# Patient Record
Sex: Male | Born: 1980 | Race: White | Hispanic: No | Marital: Married | Smoking: Never smoker
Health system: Southern US, Community
[De-identification: ages and names within clinical notes are randomized; demographics above are authoritative.]

## PROBLEM LIST (undated history)

## (undated) DIAGNOSIS — I10 Essential (primary) hypertension: Secondary | ICD-10-CM

---

## 2016-09-04 DIAGNOSIS — R7989 Other specified abnormal findings of blood chemistry: Secondary | ICD-10-CM | POA: Insufficient documentation

## 2016-09-04 DIAGNOSIS — Z8639 Personal history of other endocrine, nutritional and metabolic disease: Secondary | ICD-10-CM | POA: Insufficient documentation

## 2016-09-04 DIAGNOSIS — I10 Essential (primary) hypertension: Secondary | ICD-10-CM | POA: Insufficient documentation

## 2016-09-04 DIAGNOSIS — G8929 Other chronic pain: Secondary | ICD-10-CM | POA: Insufficient documentation

## 2018-11-26 ENCOUNTER — Emergency Department
Admission: EM | Admit: 2018-11-26 | Discharge: 2018-11-26 | Disposition: A | Discharge status: Home / Self Care | Payer: 59 | Attending: Emergency Medicine | Admitting: Emergency Medicine

## 2018-11-26 ENCOUNTER — Other Ambulatory Visit: Payer: Self-pay

## 2018-11-26 ENCOUNTER — Emergency Department: Payer: 59

## 2018-11-26 DIAGNOSIS — N200 Calculus of kidney: Secondary | ICD-10-CM | POA: Insufficient documentation

## 2018-11-26 DIAGNOSIS — I10 Essential (primary) hypertension: Secondary | ICD-10-CM | POA: Diagnosis not present

## 2018-11-26 DIAGNOSIS — R1031 Right lower quadrant pain: Secondary | ICD-10-CM | POA: Diagnosis present

## 2018-11-26 DIAGNOSIS — Z79899 Other long term (current) drug therapy: Secondary | ICD-10-CM | POA: Insufficient documentation

## 2018-11-26 HISTORY — DX: Essential (primary) hypertension: I10

## 2018-11-26 LAB — CBC
HCT: 44 % (ref 39.0–52.0)
Hemoglobin: 15.3 g/dL (ref 13.0–17.0)
MCH: 29.5 pg (ref 26.0–34.0)
MCHC: 34.8 g/dL (ref 30.0–36.0)
MCV: 84.8 fL (ref 80.0–100.0)
Platelets: 203 10*3/uL (ref 150–400)
RBC: 5.19 MIL/uL (ref 4.22–5.81)
RDW: 12.4 % (ref 11.5–15.5)
WBC: 15.7 10*3/uL — ABNORMAL HIGH (ref 4.0–10.5)
nRBC: 0 % (ref 0.0–0.2)

## 2018-11-26 LAB — COMPREHENSIVE METABOLIC PANEL
ALT: 67 U/L — ABNORMAL HIGH (ref 0–44)
AST: 25 U/L (ref 15–41)
Albumin: 4.4 g/dL (ref 3.5–5.0)
Alkaline Phosphatase: 62 U/L (ref 38–126)
Anion gap: 11 (ref 5–15)
BUN: 23 mg/dL — ABNORMAL HIGH (ref 6–20)
CO2: 27 mmol/L (ref 22–32)
Calcium: 9.4 mg/dL (ref 8.9–10.3)
Chloride: 98 mmol/L (ref 98–111)
Creatinine, Ser: 1.08 mg/dL (ref 0.61–1.24)
GFR calc Af Amer: >60 mL/min (ref >60)
GFR calc non Af Amer: >60 mL/min (ref >60)
Glucose, Bld: 110 mg/dL — ABNORMAL HIGH (ref 70–99)
Potassium: 4.3 mmol/L (ref 3.5–5.1)
Sodium: 136 mmol/L (ref 135–145)
Total Bilirubin: 1.4 mg/dL — ABNORMAL HIGH (ref 0.3–1.2)
Total Protein: 8 g/dL (ref 6.5–8.1)

## 2018-11-26 LAB — URINALYSIS, COMPLETE (UACMP) WITH MICROSCOPIC
Bacteria, UA: NONE SEEN
Bilirubin Urine: NEGATIVE
Glucose, UA: NEGATIVE mg/dL
Hgb urine dipstick: NEGATIVE
Ketones, ur: NEGATIVE mg/dL
Leukocytes,Ua: NEGATIVE
Nitrite: NEGATIVE
Protein, ur: NEGATIVE mg/dL
Specific Gravity, Urine: 1.02 (ref 1.005–1.030)
pH: 7 (ref 5.0–8.0)

## 2018-11-26 LAB — LIPASE, BLOOD: Lipase: 18 U/L (ref 11–51)

## 2018-11-26 MED ORDER — IOHEXOL 9 MG/ML PO SOLN
1000.0000 mL | Freq: Once | ORAL | Status: AC | PRN
  Administered 2018-11-26: 1000 mL via ORAL
  Filled 2018-11-26: qty 1000

## 2018-11-26 MED ORDER — HYDROCODONE-ACETAMINOPHEN 5-325 MG PO TABS: 1.0000 | ORAL_TABLET | Freq: Four times a day (QID) | ORAL | 0 refills | Status: AC | PRN

## 2018-11-26 MED ORDER — HYDROCODONE-ACETAMINOPHEN 5-325 MG PO TABS: 1.0000 | ORAL_TABLET | Freq: Four times a day (QID) | ORAL | 0 refills | Status: DC | PRN

## 2018-11-26 MED ORDER — IOHEXOL 300 MG/ML  SOLN
100.0000 mL | Freq: Once | INTRAMUSCULAR | Status: AC | PRN
  Administered 2018-11-26: 100 mL via INTRAVENOUS
  Filled 2018-11-26: qty 100

## 2018-11-26 MED ORDER — ONDANSETRON 4 MG PO TBDP: 4.0000 mg | ORAL_TABLET | Freq: Four times a day (QID) | ORAL | 0 refills | Status: AC | PRN

## 2018-11-26 MED ORDER — TAMSULOSIN HCL 0.4 MG PO CAPS: 0.4000 mg | ORAL_CAPSULE | Freq: Every day | ORAL | 0 refills | Status: AC

## 2018-11-26 NOTE — ED Notes (Signed)
See triage note  Presents with lower abd pain and right side back pain  Denies any fever or n/v

## 2018-11-26 NOTE — ED Provider Notes (Signed)
[facility] Emergency Department Provider Note ____________________________________________   First MD Initiated Contact with Patient 11/26/18 1307     (approximate)  I have reviewed the triage vital signs and the nursing notes.  HISTORY  Chief Complaint Abdominal Pain and Constipation  HPI Benjamin Maynard is a 38 y.o. male here for evaluation of right lower and slightly right lower back pain  Patient reports that few days ago been experiencing achiness in his right lower abdomen.  Felt like he needed to have a bowel movement, started Tuesday his last bowel movement was then.  He also reports that he just this for like an aching especially when he is up moving around or walking.  No fevers or chills.  No diarrhea.  Last bowel movement was Tuesday but still passing gas.  Still eating and drinking, but eating less, only had a candy bar for breakfast this morning  There is reported history of diverticulitis maybe 10 years ago that felt somewhat similar.  Otherwise no significant past medical history other than hypertension  Denies Covid exposure  Past Medical History:  Diagnosis Date   Hypertension     There are no active problems to display for this patient.   History reviewed. No pertinent surgical history.  Prior to Admission medications   Medication Sig Start Date End Date Taking? Authorizing Provider  losartan (COZAAR) 100 MG tablet Take 100 mg by mouth daily.   Yes [provider]  pregabalin (LYRICA) 50 MG capsule Take 50 mg by mouth 3 (three) times daily.   Yes [provider]  HYDROcodone-acetaminophen (NORCO/VICODIN) 5-325 MG tablet Take 1 tablet by mouth every 6 (six) hours as needed for moderate pain. 11/26/18   Sharyn Creamer, MD  ondansetron (ZOFRAN ODT) 4 MG disintegrating tablet Take 1 tablet (4 mg total) by mouth every 6 (six) hours as needed for nausea or vomiting. 11/26/18   Sharyn Creamer, MD  tamsulosin (FLOMAX) 0.4 MG  CAPS capsule Take 1 capsule (0.4 mg total) by mouth daily. 11/26/18   Sharyn Creamer, MD    Allergies Patient has no known allergies.  No family history on file.  Social History Social History   Tobacco Use   Smoking status: Never Smoker   Smokeless tobacco: Never Used  Substance Use Topics   Alcohol use: Yes    Comment: occassionally   Drug use: Never    Review of Systems Constitutional: No fever/chills Eyes: No visual changes. ENT: No sore throat. Cardiovascular: Denies chest pain. Respiratory: Denies shortness of breath. Gastrointestinal: See HPI Genitourinary: Negative for dysuria. Musculoskeletal: Negative for back pain.  Suffers from frequent back pain, but no worsening or difference from his baseline sciatica. Skin: Negative for rash. Neurological: Negative for headaches, areas of focal weakness or numbness. Denies any groin or testicular pain.  No trouble urinating.   ____________________________________________   PHYSICAL EXAM:  VITAL SIGNS: ED Triage Vitals  Enc Vitals Group     BP 11/26/18 1117 (!) 141/104     Pulse Rate 11/26/18 1117 (!) 108     Resp 11/26/18 1117 16     Temp 11/26/18 1117 98.7 F (37.1 C)     Temp Source 11/26/18 1117 Oral     SpO2 11/26/18 1117 97 %     Weight 11/26/18 1117 290 lb (131.5 kg)     Height 11/26/18 1117 5\' 11"  (1.803 m)     Head Circumference --      Peak Flow --  Pain Score 11/26/18 1123 8     Pain Loc --      Pain Edu? --      Excl. in GC? --     Constitutional: Alert and oriented. Well appearing and in no acute distress. Eyes: Conjunctivae are normal. Head: Atraumatic. Nose: No congestion/rhinnorhea. Mouth/Throat: Mucous membranes are moist. Neck: No stridor.  Cardiovascular: Normal rate, regular rhythm. Grossly normal heart sounds.  Good peripheral circulation. Respiratory: Normal respiratory effort.  No retractions. Lungs CTAB. Gastrointestinal: Soft and mild focal tenderness primarily seated in  the suprapubic and right lower quadrant region.  No frank rebound or guarding.  No focal pain at McBurney's point but rather a moderate somewhat nonfocal tenderness primarily over the right lower and moderate flank region.  No left-sided or upper abdominal pain.  Negative Murphy. Musculoskeletal: No lower extremity tenderness nor edema. Neurologic:  Normal speech and language. No gross focal neurologic deficits are appreciated.  Skin:  Skin is warm, dry and intact. No rash noted. Psychiatric: Mood and affect are normal. Speech and behavior are normal.  ____________________________________________   LABS (all labs ordered are listed, but only abnormal results are displayed)  Labs Reviewed  COMPREHENSIVE METABOLIC PANEL - Abnormal; Notable for the following components:      Result Value   Glucose, Bld 110 (*)    BUN 23 (*)    ALT 67 (*)    Total Bilirubin 1.4 (*)    All other components within normal limits  CBC - Abnormal; Notable for the following components:   WBC 15.7 (*)    All other components within normal limits  URINALYSIS, COMPLETE (UACMP) WITH MICROSCOPIC - Abnormal; Notable for the following components:   Color, Urine YELLOW (*)    APPearance CLEAR (*)    All other components within normal limits  LIPASE, BLOOD   ____________________________________________  EKG   ____________________________________________  RADIOLOGY  Ct Abdomen Pelvis W Contrast  Result Date: 11/26/2018 CLINICAL DATA:  Right lower quadrant abdomen pain. Assess for appendicitis. EXAM: CT ABDOMEN AND PELVIS WITH CONTRAST TECHNIQUE: Multidetector CT imaging of the abdomen and pelvis was performed using the standard protocol following bolus administration of intravenous contrast. CONTRAST:  100mL OMNIPAQUE IOHEXOL 300 MG/ML  SOLN COMPARISON:  None. FINDINGS: Lower chest: No acute abnormality. Hepatobiliary: Diffuse low density of liver is identified without focal liver lesion. Gallbladder and biliary  tree are normal. Pancreas: Unremarkable. No pancreatic ductal dilatation or surrounding inflammatory changes. Spleen: Normal in size without focal abnormality. Adrenals/Urinary Tract: The bilateral adrenal glands are normal. There is right hydroureteronephrosis. There is a question 1 mm stone at the right ureteral vesicular junction. Small nonobstructing stone is identified in the right kidney. The left kidney is normal. There is no left hydronephrosis. The bladder is normal. Stomach/Bowel: Stomach is within normal limits. Appendix appears normal. No evidence of bowel wall thickening, distention, or inflammatory changes. Vascular/Lymphatic: No significant vascular findings are present. No enlarged abdominal or pelvic lymph nodes. Reproductive: Prostate is unremarkable. Other: None. Musculoskeletal: No acute abnormality. IMPRESSION: Normal appendix. Right hydronephrosis with question 1 mm obstructing stone in the right ureteral vesicular junction. Electronically Signed   By: Sherian ReinWei-Chen  Lin M.D.   On: 11/26/2018 15:29     Imaging reviewed, probable right-sided very small kidney stone. ____________________________________________   PROCEDURES  Procedure(s) performed: None  Procedures  Critical Care performed: No  ____________________________________________   INITIAL IMPRESSION / ASSESSMENT AND PLAN / ED COURSE  Pertinent labs & imaging results that were available during my  care of the patient were reviewed by me and considered in my medical decision making (see chart for details).   Differential diagnosis includes but is not limited to, abdominal perforation, aortic dissection, cholecystitis, appendicitis, diverticulitis, colitis, esophagitis/gastritis, kidney stone, pyelonephritis, urinary tract infection, aortic aneurysm. All are considered in decision and treatment plan. Based upon the patient's presentation and risk factors, proceed with CT scan to further evaluate exclude appendicitis and  other causes   ----------------------------------------- 3:46 PM on 11/26/2018 ----------------------------------------- Patient ambulatory, CT appears consistent with symptomatology of kidney stone.  Very small.  No evidence of complication.  Slight leukocytosis, but very clean urine.  No evidence of infection no fever.  Patient comfortable with plan for close follow-up. I will prescribe the patient a narcotic pain medicine due to their condition which I anticipate will cause at least moderate pain short term. I discussed with the patient safe use of narcotic pain medicines, and that they are not to drive, work in dangerous areas, or ever take more than prescribed (no more than 1 pill every 6 hours). We discussed that this is the type of medication that can be  overdosed on and the risks of this type of medicine. Patient is very agreeable to only use as prescribed and to never use more than prescribed.  Return precautions and treatment recommendations and follow-up discussed with the patient who is agreeable with the plan.        ____________________________________________   FINAL CLINICAL IMPRESSION(S) / ED DIAGNOSES  Final diagnoses:  Kidney stone on right side        Note:  This document was prepared using Dragon voice recognition software and may include unintentional dictation errors       Delman Kitten, MD 11/26/18 1547

## 2018-11-26 NOTE — ED Triage Notes (Signed)
Reports bilateral lower abdominal pain and right lower back pain  Since Wednesday. Pt has not had BM since Tuesday. Denies NVD. Pt alert and oriented X4, cooperative, RR even and unlabored, color WNL. Pt in NAD.

## 2018-11-26 NOTE — Discharge Instructions (Signed)
You have been seen in the Emergency Department (ED) today for pain that we believe based on your workup, is caused by kidney stones.  As we have discussed, please drink plenty of fluids.  Please make a follow up appointment with the physician(s) listed elsewhere in this documentation. ° °You may take pain medication as needed but ONLY as prescribed.  Please also take your prescribed Flomax daily.  We also recommend that you take over-the-counter ibuprofen regularly according to label instructions over the next 5 days.  Take it with meals to minimize stomach discomfort. ° °Please see your doctor as soon as possible as stones may take 1-3 weeks to pass and you may require additional care or medications. ° °Do not drink alcohol, drive or participate in any other potentially dangerous activities while taking opiate pain medication as it may make you sleepy. Do not take this medication with any other sedating medications, either prescription or over-the-counter. If you were prescribed Percocet or Vicodin, do not take these with acetaminophen (Tylenol) as it is already contained within these medications. °  °This medication is an opiate (or narcotic) pain medication and can be habit forming.  Use it as little as possible to achieve adequate pain control.  Do not use or use it with extreme caution if you have a history of opiate abuse or dependence.  If you are on a pain contract with your primary care doctor or a pain specialist, be sure to let them know you were prescribed this medication today from the [facility].  This medication is intended for your use only - do not give any to anyone else and keep it in a secure place where nobody else, especially children, have access to it.  It will also cause or worsen constipation, so you may want to consider taking an over-the-counter stool softener while you are taking this medication. ° °Return to the Emergency Department (ED) or call your doctor  if you have any worsening pain, fever, painful urination, are unable to urinate, or develop other symptoms that concern you. ° °

## 2018-12-08 DIAGNOSIS — E349 Endocrine disorder, unspecified: Secondary | ICD-10-CM | POA: Insufficient documentation

## 2020-12-31 ENCOUNTER — Other Ambulatory Visit: Payer: Self-pay

## 2020-12-31 ENCOUNTER — Ambulatory Visit
Admission: RE | Admit: 2020-12-31 | Discharge: 2020-12-31 | Disposition: A | Discharge status: Home / Self Care | Payer: 59 | Source: Ambulatory Visit | Attending: Family Medicine | Admitting: Family Medicine

## 2020-12-31 VITALS — BP 162/90 | HR 124 | Temp 98.2°F | Resp 20

## 2020-12-31 DIAGNOSIS — J069 Acute upper respiratory infection, unspecified: Secondary | ICD-10-CM

## 2020-12-31 DIAGNOSIS — J209 Acute bronchitis, unspecified: Secondary | ICD-10-CM | POA: Diagnosis not present

## 2020-12-31 MED ORDER — PREDNISONE 20 MG PO TABS: 20.0000 mg | ORAL_TABLET | Freq: Every day | ORAL | 0 refills | Status: AC

## 2020-12-31 MED ORDER — LEVOCETIRIZINE DIHYDROCHLORIDE 5 MG PO TABS: 5.0000 mg | ORAL_TABLET | Freq: Every evening | ORAL | 0 refills | Status: AC

## 2020-12-31 MED ORDER — ALBUTEROL SULFATE HFA 108 (90 BASE) MCG/ACT IN AERS: 2.0000 | INHALATION_SPRAY | Freq: Four times a day (QID) | RESPIRATORY_TRACT | 0 refills | Status: AC | PRN

## 2020-12-31 NOTE — ED Triage Notes (Signed)
Pt here with URI sx x 5 days. Intermittent low grade fevers.

## 2020-12-31 NOTE — ED Provider Notes (Signed)
Renaldo Fiddler    CSN: [id number] Arrival date & time: 12/31/20  1230      History   Chief Complaint Chief Complaint  Patient presents with   Cough   Nasal Congestion    HPI Benjamin Maynard is a 40 y.o. male.   HPI Patient presents today with a 5-day history of nasal congestion, sore throat, generalized body aches, and a persistent cough which is now developed into chest congestion and chest tightness.  Patient denies a known history of asthma however has had some reactive airway symptoms with previous colds and required an albuterol inhaler.  He endorses some mild shortness of breath intermittently.  All of his symptoms are worse upon awakening and with going to bed in the evenings.  He is currently afebrile.  Past Medical History:  Diagnosis Date   Hypertension     There are no problems to display for this patient.   History reviewed. No pertinent surgical history.     Home Medications    Prior to Admission medications   Medication Sig Start Date End Date Taking? Authorizing Provider  albuterol (VENTOLIN HFA) 108 (90 Base) MCG/ACT inhaler Inhale 2 puffs into the lungs every 6 (six) hours as needed for wheezing or shortness of breath. 12/31/20  Yes Bing Neighbors, FNP  levocetirizine (XYZAL) 5 MG tablet Take 1 tablet (5 mg total) by mouth every evening. 12/31/20  Yes Bing Neighbors, FNP  predniSONE (DELTASONE) 20 MG tablet Take 1 tablet (20 mg total) by mouth daily with breakfast for 5 days. 12/31/20 01/05/21 Yes Bing Neighbors, FNP  HYDROcodone-acetaminophen (NORCO/VICODIN) 5-325 MG tablet Take 1 tablet by mouth every 6 (six) hours as needed for moderate pain. 11/26/18   Sharyn Creamer, MD  losartan (COZAAR) 100 MG tablet Take 100 mg by mouth daily.    [provider]  ondansetron (ZOFRAN ODT) 4 MG disintegrating tablet Take 1 tablet (4 mg total) by mouth every 6 (six) hours as needed for nausea or vomiting. 11/26/18   Sharyn Creamer, MD   pregabalin (LYRICA) 50 MG capsule Take 50 mg by mouth 3 (three) times daily.    [provider]  tamsulosin (FLOMAX) 0.4 MG CAPS capsule Take 1 capsule (0.4 mg total) by mouth daily. 11/26/18   Sharyn Creamer, MD    Family History Family History  Family history unknown: Yes    Social History Social History   Tobacco Use   Smoking status: Never   Smokeless tobacco: Never  Substance Use Topics   Alcohol use: Yes    Comment: occassionally   Drug use: Never     Allergies   Patient has no known allergies.   Review of Systems Review of Systems Pertinent negatives listed in HPI   Physical Exam Triage Vital Signs ED Triage Vitals  Enc Vitals Group     BP 12/31/20 1248 (!) 162/90     Pulse Rate 12/31/20 1248 (!) 124     Resp 12/31/20 1248 20     Temp 12/31/20 1248 98.2 F (36.8 C)     Temp src --      SpO2 12/31/20 1248 99 %     Weight --      Height --      Head Circumference --      Peak Flow --      Pain Score 12/31/20 1252 3     Pain Loc --      Pain Edu? --      Excl.  in GC? --    No data found.  Updated Vital Signs BP (!) 162/90    Pulse (!) 124    Temp 98.2 F (36.8 C)    Resp 20    SpO2 99%   Visual Acuity Right Eye Distance:   Left Eye Distance:   Bilateral Distance:    Right Eye Near:   Left Eye Near:    Bilateral Near:     Physical Exam Vitals reviewed.  Constitutional:      Appearance: He is obese. He is ill-appearing.  HENT:     Head: Normocephalic.     Nose: Congestion present.  Eyes:     Extraocular Movements: Extraocular movements intact.     Pupils: Pupils are equal, round, and reactive to light.  Cardiovascular:     Rate and Rhythm: Regular rhythm. Tachycardia present.  Pulmonary:     Effort: Pulmonary effort is normal.     Breath sounds: Rhonchi present.     Comments: Expiratory wheeze present  Lymphadenopathy:     Cervical: No cervical adenopathy.  Skin:    Capillary Refill: Capillary refill takes less than 2  seconds.  Neurological:     General: No focal deficit present.     Mental Status: He is alert and oriented to person, place, and time.     UC Treatments / Results  Labs (all labs ordered are listed, but only abnormal results are displayed) Labs Reviewed - No data to display  EKG   Radiology No results found.  Procedures Procedures (including critical care time)  Medications Ordered in UC Medications - No data to display  Initial Impression / Assessment and Plan / UC Course  I have reviewed the triage vital signs and the nursing notes.  Pertinent labs & imaging results that were available during my care of the patient were reviewed by me and considered in my medical decision making (see chart for details).     Acute bronchitis likely secondary to recent viral upper respiratory illness.  Treatment today with prednisone 20 mg once daily for 5 days.  Albuterol inhaler 2 puffs every 4-6 hours as needed for chest tightness or shortness of breath.  Levocetirizine nightly at bedtime for management of nasal symptoms.  Return precautions given if symptoms worsen or do not improve.  Force fluids. Final Clinical Impressions(s) / UC Diagnoses   Final diagnoses:  Acute bronchitis, unspecified organism  Acute URI   Discharge Instructions   None    ED Prescriptions     Medication Sig Dispense Auth. Provider   predniSONE (DELTASONE) 20 MG tablet Take 1 tablet (20 mg total) by mouth daily with breakfast for 5 days. 5 tablet Bing Neighbors, FNP   albuterol (VENTOLIN HFA) 108 (90 Base) MCG/ACT inhaler Inhale 2 puffs into the lungs every 6 (six) hours as needed for wheezing or shortness of breath. 8 g Bing Neighbors, FNP   levocetirizine (XYZAL) 5 MG tablet Take 1 tablet (5 mg total) by mouth every evening. 30 tablet Bing Neighbors, FNP      PDMP not reviewed this encounter.   Bing Neighbors, FNP 12/31/20 1322

## 2021-02-26 ENCOUNTER — Other Ambulatory Visit: Payer: Self-pay | Admitting: Family Medicine

## 2021-03-05 ENCOUNTER — Other Ambulatory Visit: Payer: Self-pay

## 2021-03-05 ENCOUNTER — Ambulatory Visit: Payer: 59

## 2021-03-05 ENCOUNTER — Emergency Department: Payer: 59

## 2021-03-05 ENCOUNTER — Emergency Department
Admission: EM | Admit: 2021-03-05 | Discharge: 2021-03-05 | Disposition: A | Discharge status: Home / Self Care | Payer: 59 | Attending: Student in an Organized Health Care Education/Training Program | Admitting: Student in an Organized Health Care Education/Training Program

## 2021-03-05 DIAGNOSIS — R109 Unspecified abdominal pain: Secondary | ICD-10-CM | POA: Diagnosis present

## 2021-03-05 DIAGNOSIS — K429 Umbilical hernia without obstruction or gangrene: Secondary | ICD-10-CM | POA: Insufficient documentation

## 2021-03-05 DIAGNOSIS — Z79899 Other long term (current) drug therapy: Secondary | ICD-10-CM | POA: Diagnosis not present

## 2021-03-05 DIAGNOSIS — I10 Essential (primary) hypertension: Secondary | ICD-10-CM | POA: Insufficient documentation

## 2021-03-05 LAB — URINALYSIS, ROUTINE W REFLEX MICROSCOPIC
Bilirubin Urine: NEGATIVE
Glucose, UA: NEGATIVE mg/dL
Hgb urine dipstick: NEGATIVE
Ketones, ur: NEGATIVE mg/dL
Leukocytes,Ua: NEGATIVE
Nitrite: NEGATIVE
Protein, ur: NEGATIVE mg/dL
Specific Gravity, Urine: 1.004 — ABNORMAL LOW (ref 1.005–1.030)
pH: 7 (ref 5.0–8.0)

## 2021-03-05 LAB — COMPREHENSIVE METABOLIC PANEL
ALT: 64 U/L — ABNORMAL HIGH (ref 0–44)
AST: 40 U/L (ref 15–41)
Albumin: 4.5 g/dL (ref 3.5–5.0)
Alkaline Phosphatase: 52 U/L (ref 38–126)
Anion gap: 11 (ref 5–15)
BUN: 17 mg/dL (ref 6–20)
CO2: 29 mmol/L (ref 22–32)
Calcium: 9.7 mg/dL (ref 8.9–10.3)
Chloride: 101 mmol/L (ref 98–111)
Creatinine, Ser: 0.85 mg/dL (ref 0.61–1.24)
GFR, Estimated: >60 mL/min (ref >60)
Glucose, Bld: 100 mg/dL — ABNORMAL HIGH (ref 70–99)
Potassium: 4 mmol/L (ref 3.5–5.1)
Sodium: 141 mmol/L (ref 135–145)
Total Bilirubin: 2 mg/dL — ABNORMAL HIGH (ref 0.3–1.2)
Total Protein: 7.4 g/dL (ref 6.5–8.1)

## 2021-03-05 LAB — CBC
HCT: 49.7 % (ref 39.0–52.0)
Hemoglobin: 16.3 g/dL (ref 13.0–17.0)
MCH: 28.5 pg (ref 26.0–34.0)
MCHC: 32.8 g/dL (ref 30.0–36.0)
MCV: 87 fL (ref 80.0–100.0)
Platelets: 236 10*3/uL (ref 150–400)
RBC: 5.71 MIL/uL (ref 4.22–5.81)
RDW: 14.6 % (ref 11.5–15.5)
WBC: 8.8 10*3/uL (ref 4.0–10.5)
nRBC: 0 % (ref 0.0–0.2)

## 2021-03-05 LAB — LIPASE, BLOOD: Lipase: 29 U/L (ref 11–51)

## 2021-03-05 MED ORDER — IOHEXOL 300 MG/ML  SOLN
100.0000 mL | Freq: Once | INTRAMUSCULAR | Status: AC | PRN
  Administered 2021-03-05: 100 mL via INTRAVENOUS

## 2021-03-05 NOTE — ED Provider Notes (Signed)
[facility] EMERGENCY DEPARTMENT Provider Note   CSN: [id number] Arrival date & time: 03/05/21  1415     History  Chief Complaint  Patient presents with   Abdominal Pain   Hernia    Benjamin Maynard is a 41 y.o. male.  With history of hypertension and chronic umbilical hernia presents to the emergency department for evaluation of abdominal pain.  States he has had pain for few days with no known trauma or injury.  Pain has slowly been improving.  Pain is currently 4 out of 10.  He is going out of town at the end of this week and was wanting to be evaluated before traveling.  He has been taking Tylenol and ibuprofen with some relief.  He denies any nausea, vomiting, fever, chills.  HPI     Home Medications Prior to Admission medications   Medication Sig Start Date End Date Taking? Authorizing Provider  albuterol (VENTOLIN HFA) 108 (90 Base) MCG/ACT inhaler Inhale 2 puffs into the lungs every 6 (six) hours as needed for wheezing or shortness of breath. 12/31/20   Scot Jun, FNP  HYDROcodone-acetaminophen (NORCO/VICODIN) 5-325 MG tablet Take 1 tablet by mouth every 6 (six) hours as needed for moderate pain. 11/26/18   Delman Kitten, MD  levocetirizine (XYZAL) 5 MG tablet Take 1 tablet (5 mg total) by mouth every evening. 12/31/20   Scot Jun, FNP  losartan (COZAAR) 100 MG tablet Take 100 mg by mouth daily.    [provider]  ondansetron (ZOFRAN ODT) 4 MG disintegrating tablet Take 1 tablet (4 mg total) by mouth every 6 (six) hours as needed for nausea or vomiting. 11/26/18   Delman Kitten, MD  pregabalin (LYRICA) 50 MG capsule Take 50 mg by mouth 3 (three) times daily.    [provider]  tamsulosin (FLOMAX) 0.4 MG CAPS capsule Take 1 capsule (0.4 mg total) by mouth daily. 11/26/18   Delman Kitten, MD      Allergies    Patient has no known allergies.    Review of Systems   Review of Systems  Physical Exam Updated Vital  Signs BP 133/90 (BP Location: Left Arm)    Pulse 93    Temp 97.9 F (36.6 C) (Oral)    Resp 16    Ht 5\' 11"  (1.803 m)    Wt 133.8 kg    SpO2 97%    BMI 41.14 kg/m  Physical Exam Constitutional:      Appearance: He is well-developed.  HENT:     Head: Normocephalic and atraumatic.  Eyes:     Conjunctiva/sclera: Conjunctivae normal.  Cardiovascular:     Rate and Rhythm: Normal rate.  Pulmonary:     Effort: Pulmonary effort is normal. No respiratory distress.     Breath sounds: Normal breath sounds.  Abdominal:     General: Abdomen is flat.     Palpations: Abdomen is soft.     Tenderness: There is abdominal tenderness (mild) in the periumbilical area. There is no guarding or rebound.     Hernia: A hernia is present. Hernia is present in the umbilical area (soft, reducible no warmth or redness).  Musculoskeletal:        General: Normal range of motion.     Cervical back: Normal range of motion.  Skin:    General: Skin is warm.     Findings: No rash.  Neurological:     Mental Status: He is alert and oriented to person, place, and  time.  Psychiatric:        Behavior: Behavior normal.        Thought Content: Thought content normal.    ED Results / Procedures / Treatments   Labs (all labs ordered are listed, but only abnormal results are displayed) Labs Reviewed  COMPREHENSIVE METABOLIC PANEL - Abnormal; Notable for the following components:      Result Value   Glucose, Bld 100 (*)    ALT 64 (*)    Total Bilirubin 2.0 (*)    All other components within normal limits  URINALYSIS, ROUTINE W REFLEX MICROSCOPIC - Abnormal; Notable for the following components:   Color, Urine STRAW (*)    APPearance CLEAR (*)    Specific Gravity, Urine 1.004 (*)    All other components within normal limits  LIPASE, BLOOD  CBC    EKG None  Radiology CT ABDOMEN PELVIS W CONTRAST  Result Date: 03/05/2021 CLINICAL DATA:  Abdominal pain.  Known hernia for 10 years. EXAM: CT ABDOMEN AND PELVIS  WITH CONTRAST TECHNIQUE: Multidetector CT imaging of the abdomen and pelvis was performed using the standard protocol following bolus administration of intravenous contrast. RADIATION DOSE REDUCTION: This exam was performed according to the departmental dose-optimization program which includes automated exposure control, adjustment of the mA and/or kV according to patient size and/or use of iterative reconstruction technique. CONTRAST:  180mL OMNIPAQUE IOHEXOL 300 MG/ML  SOLN COMPARISON:  11/26/2018 FINDINGS: Lower chest: No acute abnormality. Hepatobiliary: No focal liver abnormality is seen. No gallstones, gallbladder wall thickening, or biliary dilatation. Pancreas: Unremarkable. No pancreatic ductal dilatation or surrounding inflammatory changes. Spleen: Normal in size without focal abnormality. Adrenals/Urinary Tract: Adrenal glands are unremarkable. Punctate nonobstructing right renal calculus. Punctate nonobstructing left renal calculus. No hydronephrosis. No renal mass. Normal bladder. Stomach/Bowel: Stomach is within normal limits. No evidence of bowel wall thickening, distention, or inflammatory changes. Appendix is normal. Vascular/Lymphatic: No significant vascular findings are present. No enlarged abdominal or pelvic lymph nodes. Reproductive: Prostate is unremarkable. Other: No abdominal wall hernia or abnormality. No abdominopelvic ascites. Musculoskeletal: No acute osseous abnormality. No aggressive osseous lesion. Mild bilateral facet arthropathy at L3-4, L4-5 and L5-S1. IMPRESSION: 1. No acute abdominal or pelvic pathology. 2. Punctate nonobstructing bilateral renal calculi. Electronically Signed   By: Kathreen Devoid M.D.   On: 03/05/2021 16:25    Procedures Procedures    Medications Ordered in ED Medications  iohexol (OMNIPAQUE) 300 MG/ML solution 100 mL (100 mLs Intravenous Contrast Given 03/05/21 1614)    ED Course/ Medical Decision Making/ A&P                           Medical  Decision Making Amount and/or Complexity of Data Reviewed Labs: ordered.   41 year old male with history of umbilical hernia presents with several days of pain along the umbilical region.  Abdomen soft minimally tender to palpation.  Overall his pain has been improving.  Blood work is reassuring, normal with no leukocytosis.  Normal urinalysis.  CT scan was ordered and reviewed by me today showing no signs of incarceration hernia, infectious process or appendicitis.  Patient appears well in no distress.  Vital signs are stable and he is ready for discharge.  He is encouraged to follow-up with general surgery, will call tomorrow to schedule follow-up appointment. Final Clinical Impression(s) / ED Diagnoses Final diagnoses:  Umbilical hernia without obstruction and without gangrene    Rx / DC Orders ED Discharge Orders  None         Renata Caprice 03/05/21 1816    Merlyn Lot, MD 03/05/21 2005

## 2021-03-05 NOTE — ED Triage Notes (Signed)
Pt here with abd pain and a hernia that began hurting on yesterday. Pt has a known hernia for 10 years but states that it was hard and painful and radiating all over his abd. Pt denies N/V/D. Pt in NAD in triage.

## 2021-03-05 NOTE — Discharge Instructions (Signed)
Please alternate Tylenol and ibuprofen as needed for any pain.  Avoid any heavy lifting pushing or pulling.  If any reoccurring pain/swelling that is severe and not improving would recommend returning to the ER.

## 2021-03-05 NOTE — ED Provider Triage Note (Signed)
Emergency Medicine Provider Triage Evaluation Note  Benjamin Maynard , a 41 y.o. male  was evaluated in triage.  Pt complains of umbilical, abdominal pain, worsening umbilical hernia.  Review of Systems  Positive: Abdominal pain with umbilical hernia Negative: Fever, chills  Physical Exam  BP (!) 143/99 (BP Location: Left Arm)    Pulse 100    Temp 97.9 F (36.6 C) (Oral)    Resp 18    Ht 5\' 11"  (1.803 m)    Wt 133.8 kg    SpO2 95%    BMI 41.14 kg/m  Gen:   Awake, no distress   Resp:  Normal effort  MSK:   Moves extremities without difficulty  Other:    Medical Decision Making  Medically screening exam initiated at 2:45 PM.  Appropriate orders placed.  [name] was informed that the remainder of the evaluation will be completed by another provider, this initial triage assessment does not replace that evaluation, and the importance of remaining in the ED until their evaluation is complete.  We will do CT with contrast to assess hernia   [name], PA-C 03/05/21 1447

## 2021-08-20 ENCOUNTER — Emergency Department
Admission: EM | Admit: 2021-08-20 | Discharge: 2021-08-20 | Disposition: A | Discharge status: Home / Self Care | Payer: 59 | Attending: Emergency Medicine | Admitting: Emergency Medicine

## 2021-08-20 ENCOUNTER — Encounter: Payer: Self-pay | Admitting: Emergency Medicine

## 2021-08-20 ENCOUNTER — Other Ambulatory Visit: Payer: Self-pay

## 2021-08-20 DIAGNOSIS — K2900 Acute gastritis without bleeding: Secondary | ICD-10-CM | POA: Diagnosis not present

## 2021-08-20 DIAGNOSIS — I10 Essential (primary) hypertension: Secondary | ICD-10-CM | POA: Insufficient documentation

## 2021-08-20 DIAGNOSIS — R1013 Epigastric pain: Secondary | ICD-10-CM | POA: Diagnosis present

## 2021-08-20 LAB — CBC
HCT: 52.8 % — ABNORMAL HIGH (ref 39.0–52.0)
Hemoglobin: 17.6 g/dL — ABNORMAL HIGH (ref 13.0–17.0)
MCH: 28.7 pg (ref 26.0–34.0)
MCHC: 33.3 g/dL (ref 30.0–36.0)
MCV: 86 fL (ref 80.0–100.0)
Platelets: 226 10*3/uL (ref 150–400)
RBC: 6.14 MIL/uL — ABNORMAL HIGH (ref 4.22–5.81)
RDW: 12.8 % (ref 11.5–15.5)
WBC: 10 10*3/uL (ref 4.0–10.5)
nRBC: 0 % (ref 0.0–0.2)

## 2021-08-20 LAB — URINALYSIS, ROUTINE W REFLEX MICROSCOPIC
Bilirubin Urine: NEGATIVE
Glucose, UA: NEGATIVE mg/dL
Hgb urine dipstick: NEGATIVE
Ketones, ur: NEGATIVE mg/dL
Leukocytes,Ua: NEGATIVE
Nitrite: NEGATIVE
Protein, ur: 30 mg/dL — AB
Specific Gravity, Urine: 1.02 (ref 1.005–1.030)
Squamous Epithelial / HPF: NONE SEEN (ref 0–5)
pH: 7 (ref 5.0–8.0)

## 2021-08-20 LAB — COMPREHENSIVE METABOLIC PANEL
ALT: 74 U/L — ABNORMAL HIGH (ref 0–44)
AST: 33 U/L (ref 15–41)
Albumin: 4.4 g/dL (ref 3.5–5.0)
Alkaline Phosphatase: 73 U/L (ref 38–126)
Anion gap: 8 (ref 5–15)
BUN: 10 mg/dL (ref 6–20)
CO2: 27 mmol/L (ref 22–32)
Calcium: 9.4 mg/dL (ref 8.9–10.3)
Chloride: 105 mmol/L (ref 98–111)
Creatinine, Ser: 0.68 mg/dL (ref 0.61–1.24)
GFR, Estimated: >60 mL/min (ref >60)
Glucose, Bld: 129 mg/dL — ABNORMAL HIGH (ref 70–99)
Potassium: 4.4 mmol/L (ref 3.5–5.1)
Sodium: 140 mmol/L (ref 135–145)
Total Bilirubin: 1.2 mg/dL (ref 0.3–1.2)
Total Protein: 7.8 g/dL (ref 6.5–8.1)

## 2021-08-20 LAB — LIPASE, BLOOD: Lipase: 28 U/L (ref 11–51)

## 2021-08-20 LAB — TROPONIN I (HIGH SENSITIVITY): Troponin I (High Sensitivity): 5 ng/L (ref <18)

## 2021-08-20 MED ORDER — ALUM & MAG HYDROXIDE-SIMETH 200-200-20 MG/5ML PO SUSP
15.0000 mL | Freq: Once | ORAL | Status: AC
  Administered 2021-08-20: 15 mL via ORAL
  Filled 2021-08-20: qty 30

## 2021-08-20 MED ORDER — OMEPRAZOLE MAGNESIUM 20 MG PO TBEC: 20.0000 mg | DELAYED_RELEASE_TABLET | Freq: Every day | ORAL | 1 refills | Status: AC

## 2021-08-20 MED ORDER — LIDOCAINE VISCOUS HCL 2 % MT SOLN
15.0000 mL | Freq: Once | OROMUCOSAL | Status: AC
  Administered 2021-08-20: 15 mL via OROMUCOSAL
  Filled 2021-08-20: qty 15

## 2021-08-20 NOTE — ED Provider Notes (Signed)
[facility] Provider Note    Event Date/Time   First MD Initiated Contact with Patient 08/20/21 0930     (approximate)   History   Chief Complaint Abdominal Pain   HPI  Benjamin Maynard is a 41 y.o. male with past medical history of hypertension who presents to the ED complaining of abdominal pain.  Patient reports that he has had about 4 days of constant pain starting in the bilateral lower quadrants of his abdomen and moving upwards towards his epigastric area and lower chest.  He describes the pain as mild and a dull ache, currently rated as a 3 out of 10.  Pain does not seem to be exacerbated or alleviated by anything particular.  He states he has been able to eat and drink without difficulty.  He does state he feels nauseous at times like he has to throw up or have a bowel movement, denies any current nausea and has not vomited.  His bowel movements have been normal with no diarrhea or constipation, he also denies any hematuria or dysuria.  He had recent umbilical hernia repair, denies any pain or swelling around his umbilicus.      Physical Exam   Triage Vital Signs: ED Triage Vitals [08/20/21 0758]  Enc Vitals Group     BP (!) 149/108     Pulse Rate (!) 103     Resp 18     Temp 98 F (36.7 C)     Temp Source Oral     SpO2 98 %     Weight (!) 305 lb (138.3 kg)     Height 5\' 11"  (1.803 m)     Head Circumference      Peak Flow      Pain Score 4     Pain Loc      Pain Edu?      Excl. in GC?     Most recent vital signs: Vitals:   08/20/21 0758 08/20/21 0954  BP: (!) 149/108 (!) 145/105  Pulse: (!) 103   Resp: 18   Temp: 98 F (36.7 C)   SpO2: 98%     Constitutional: Alert and oriented. Eyes: Conjunctivae are normal. Head: Atraumatic. Nose: No congestion/rhinnorhea. Mouth/Throat: Mucous membranes are moist.  Cardiovascular: Normal rate, regular rhythm. Grossly normal heart sounds.  2+ radial pulses bilaterally. Respiratory:  Normal respiratory effort.  No retractions. Lungs CTAB. Gastrointestinal: Soft and nontender. No distention. Musculoskeletal: No lower extremity tenderness nor edema.  Neurologic:  Normal speech and language. No gross focal neurologic deficits are appreciated.    ED Results / Procedures / Treatments   Labs (all labs ordered are listed, but only abnormal results are displayed) Labs Reviewed  COMPREHENSIVE METABOLIC PANEL - Abnormal; Notable for the following components:      Result Value   Glucose, Bld 129 (*)    ALT 74 (*)    All other components within normal limits  CBC - Abnormal; Notable for the following components:   RBC 6.14 (*)    Hemoglobin 17.6 (*)    HCT 52.8 (*)    All other components within normal limits  URINALYSIS, ROUTINE W REFLEX MICROSCOPIC - Abnormal; Notable for the following components:   Color, Urine AMBER (*)    APPearance HAZY (*)    Protein, ur 30 (*)    Bacteria, UA RARE (*)    All other components within normal limits  LIPASE, BLOOD  TROPONIN I (HIGH SENSITIVITY)  EKG  ED ECG REPORT I, Chesley Noon, the attending physician, personally viewed and interpreted this ECG.   Date: 08/20/2021  EKG Time: 8:04  Rate: 95  Rhythm: normal sinus rhythm  Axis: Normal  Intervals:none  ST&T Change: None  PROCEDURES:  Critical Care performed: No  Procedures   MEDICATIONS ORDERED IN ED: Medications  alum & mag hydroxide-simeth (MAALOX/MYLANTA) 200-200-20 MG/5ML suspension 15 mL (15 mLs Oral Given 08/20/21 1026)  lidocaine (XYLOCAINE) 2 % viscous mouth solution 15 mL (15 mLs Mouth/Throat Given 08/20/21 1026)     IMPRESSION / MDM / ASSESSMENT AND PLAN / ED COURSE  I reviewed the triage vital signs and the nursing notes.                              41 y.o. male with past medical history of hypertension presents to the ED complaining of 4 days of constant bilateral lower quadrant abdominal pain moving upwards into his epigastric area and lower  chest.  Patient's presentation is most consistent with acute presentation with potential threat to life or bodily function.  Differential diagnosis includes, but is not limited to, diverticulitis, appendicitis, kidney stone, bowel obstruction, pancreatitis, hepatitis, cholecystitis, biliary colic, gastritis GERD, ACS.  Patient well-appearing and in no acute distress, vital signs are remarkable for elevated blood pressure but otherwise reassuring.  EKG shows no evidence of arrhythmia or ischemia, troponin within normal limits and I doubt ACS, PE, or dissection.  Abdominal exam is also benign and labs are reassuring with no significant anemia, leukocytosis, electrolyte abnormality, or AKI.  LFTs and lipase are also unremarkable.  Symptoms seem most consistent with gastritis/GERD and we will treat with GI cocktail, hold off on CT scan for now.  Patient reports resolution of pain following GI cocktail, he is appropriate for discharge home with PCP follow-up.  He will be prescribed PPI and was counseled to take Pepcid and Tums as needed.  He was counseled to follow-up with his PCP and to return to the ED for new or worsening symptoms, patient agrees with plan.      FINAL CLINICAL IMPRESSION(S) / ED DIAGNOSES   Final diagnoses:  Epigastric pain  Acute gastritis without hemorrhage, unspecified gastritis type     Rx / DC Orders   ED Discharge Orders          Ordered    omeprazole (PRILOSEC OTC) 20 MG tablet  Daily        08/20/21 1211             Note:  This document was prepared using Dragon voice recognition software and may include unintentional dictation errors.   Chesley Noon, MD 08/20/21 1212

## 2021-08-20 NOTE — ED Triage Notes (Signed)
Patient to ED for lower abd pain x4 days that radiates into lower chest. Patient states he feels like he "needs to poop or throw up." Patient states he did have diarrhea on Saturday night but denies vomiting.

## 2021-08-20 NOTE — ED Notes (Signed)
Pt reports relief from GI Cocktail. No more needs at this time.

## 2021-10-08 DIAGNOSIS — Z09 Encounter for follow-up examination after completed treatment for conditions other than malignant neoplasm: Secondary | ICD-10-CM | POA: Insufficient documentation

## 2021-10-17 DIAGNOSIS — E782 Mixed hyperlipidemia: Secondary | ICD-10-CM | POA: Insufficient documentation

## 2021-12-16 ENCOUNTER — Ambulatory Visit (INDEPENDENT_AMBULATORY_CARE_PROVIDER_SITE_OTHER): Payer: 59

## 2021-12-16 ENCOUNTER — Ambulatory Visit
Admission: EM | Admit: 2021-12-16 | Discharge: 2021-12-16 | Disposition: A | Discharge status: Home / Self Care | Payer: 59

## 2021-12-16 DIAGNOSIS — M79645 Pain in left finger(s): Secondary | ICD-10-CM | POA: Diagnosis not present

## 2021-12-16 MED ORDER — PREDNISONE 20 MG PO TABS: ORAL_TABLET | ORAL | 0 refills | Status: AC

## 2021-12-16 NOTE — ED Triage Notes (Signed)
Pt. Presents to UC w/ c/o pain in his left index finger that radiates down his hand for the past 4-5 days. Pt. States he has had no recent injuries.

## 2021-12-16 NOTE — ED Provider Notes (Signed)
Renaldo Fiddler    CSN: [id number] Arrival date & time: 12/16/21  1654      History   Chief Complaint Chief Complaint  Patient presents with   Hand Pain    HPI Benjamin Maynard is a 41 y.o. male.    Hand Pain    Presents to urgent care with pain in his left index finger that radiates into his hand x 4 to 5 days.  He denies any recent injury or known precipitating event.  Denies any change in sensation or motor function.  Describes pain as stabbing.  Past Medical History:  Diagnosis Date   Hypertension     Patient Active Problem List   Diagnosis Date Noted   Mixed hyperlipidemia 10/17/2021   S/P umbilical hernia repair, follow-up exam 10/08/2021   Morbid obesity with BMI of 40.0-44.9, adult (HCC) 04/18/2021   Testosterone deficiency 12/08/2018   Chronic low back pain with sciatica 09/04/2016   Essential hypertension 09/04/2016   H/O vitamin D deficiency 09/04/2016   Low testosterone level in male 09/04/2016    History reviewed. No pertinent surgical history.     Home Medications    Prior to Admission medications   Medication Sig Start Date End Date Taking? Authorizing Provider  ALPRAZolam Prudy Feeler) 0.5 MG tablet Take 1-2 tablets 30 minutes-1 hour prior to flights 03/20/20  Yes [provider]  atorvastatin (LIPITOR) 10 MG tablet Take 1 tablet by mouth daily. 10/21/21  Yes [provider]  cyclobenzaprine (FLEXERIL) 10 MG tablet Take by mouth. 09/30/21 09/25/22 Yes [provider]  metFORMIN (GLUCOPHAGE) 500 MG tablet Take by mouth. 10/21/21  Yes [provider]  omeprazole (PRILOSEC) 20 MG capsule Take 20 mg by mouth daily. 09/12/21  Yes [provider]  testosterone cypionate (DEPOTESTOSTERONE CYPIONATE) 200 MG/ML injection Inject into the muscle. 10/29/21  Yes [provider]  valsartan (DIOVAN) 320 MG tablet Take 320 mg by mouth daily. 09/23/21  Yes [provider]  valsartan-hydrochlorothiazide  (DIOVAN-HCT) 320-12.5 MG tablet Take 1 tablet by mouth daily. 10/21/21  Yes [provider]  albuterol (VENTOLIN HFA) 108 (90 Base) MCG/ACT inhaler Inhale 2 puffs into the lungs every 6 (six) hours as needed for wheezing or shortness of breath. 12/31/20   Bing Neighbors, FNP  Cholecalciferol 50 MCG (2000 UT) CAPS Take by mouth.    [provider]  HYDROcodone-acetaminophen (NORCO/VICODIN) 5-325 MG tablet Take 1 tablet by mouth every 6 (six) hours as needed for moderate pain. 11/26/18   Sharyn Creamer, MD  levocetirizine (XYZAL) 5 MG tablet Take 1 tablet (5 mg total) by mouth every evening. 12/31/20   Bing Neighbors, FNP  losartan (COZAAR) 100 MG tablet Take 100 mg by mouth daily.    [provider]  Multiple Vitamin (MULTI-VITAMIN) tablet Take 1 tablet by mouth daily.    [provider]  naproxen sodium (ALEVE) 220 MG tablet Take by mouth.    [provider]  omeprazole (PRILOSEC OTC) 20 MG tablet Take 1 tablet (20 mg total) by mouth daily. 08/20/21 08/20/22  Chesley Noon, MD  ondansetron (ZOFRAN ODT) 4 MG disintegrating tablet Take 1 tablet (4 mg total) by mouth every 6 (six) hours as needed for nausea or vomiting. 11/26/18   Sharyn Creamer, MD  pregabalin (LYRICA) 50 MG capsule Take 50 mg by mouth 3 (three) times daily.    [provider]  tamsulosin (FLOMAX) 0.4 MG CAPS capsule Take 1 capsule (0.4 mg total) by mouth daily. 11/26/18  Sharyn Creamer, MD    Family History Family History  Family history unknown: Yes    Social History Social History   Tobacco Use   Smoking status: Never   Smokeless tobacco: Never  Substance Use Topics   Alcohol use: Yes    Comment: occassionally   Drug use: Never     Allergies   Patient has no known allergies.   Review of Systems Review of Systems   Physical Exam Triage Vital Signs ED Triage Vitals [12/16/21 1728]  Enc Vitals Group     BP (!) 135/94     Pulse Rate (!) 105     Resp 17      Temp 97.9 F (36.6 C)     Temp src      SpO2 98 %     Weight      Height      Head Circumference      Peak Flow      Pain Score 5     Pain Loc      Pain Edu?      Excl. in GC?    No data found.  Updated Vital Signs BP (!) 135/94   Pulse (!) 105   Temp 97.9 F (36.6 C)   Resp 17   SpO2 98%   Visual Acuity Right Eye Distance:   Left Eye Distance:   Bilateral Distance:    Right Eye Near:   Left Eye Near:    Bilateral Near:     Physical Exam Vitals reviewed.  Constitutional:      Appearance: Normal appearance.  Musculoskeletal:     Comments: No discoloration, no change in sensation, no change in motor function, no swelling, no lesions, no discharge.  Skin:    General: Skin is warm and dry.  Neurological:     General: No focal deficit present.     Mental Status: He is alert and oriented to person, place, and time.  Psychiatric:        Mood and Affect: Mood normal.        Behavior: Behavior normal.      UC Treatments / Results  Labs (all labs ordered are listed, but only abnormal results are displayed) Labs Reviewed - No data to display  EKG   Radiology No results found.  Procedures Procedures (including critical care time)  Medications Ordered in UC Medications - No data to display  Initial Impression / Assessment and Plan / UC Course  I have reviewed the triage vital signs and the nursing notes.  Pertinent labs & imaging results that were available during my care of the patient were reviewed by me and considered in my medical decision making (see chart for details).   No evidence of fracture or dislocation on xray. There is no evidence of arthropathy.  Unknown cause of his finger pain, possibly inflammatory of unknown etiology versus neuropathy.  We discussed possible treatment with corticosteroid and warned him that steroid would likely worsen his blood glucose.  Patient states he is currently borderline.  Chart indicates recent hemoglobin A1c of  6.2.   Final Clinical Impressions(s) / UC Diagnoses   Final diagnoses:  None   Discharge Instructions   None    ED Prescriptions   None    PDMP not reviewed this encounter.   Charma Igo, [place] 12/16/21 [phone]

## 2021-12-16 NOTE — Discharge Instructions (Signed)
Please follow-up with your primary care or orthopedic provider if your symptoms do not resolve with treatment, or recur.

## 2023-01-06 IMAGING — CT CT ABD-PELV W/ CM
2 of 8 series · 14 of 46 positions shown, 18 images · IV contrast (agent unspecified)
Comparison: 11/26/2018

CLINICAL DATA: Abdominal pain.  Known hernia for 10 years.

EXAM:
CT ABDOMEN AND PELVIS WITH CONTRAST
TECHNIQUE: Multidetector CT imaging of the abdomen and pelvis was performed
using the standard protocol following bolus administration of
intravenous contrast.

[Series 2: axial st · axial · 0.98mm/px · z∈[-1037,-527]mm · 11 of 120 slices shown, 15 images]
[im 12/120  soft-tissue]
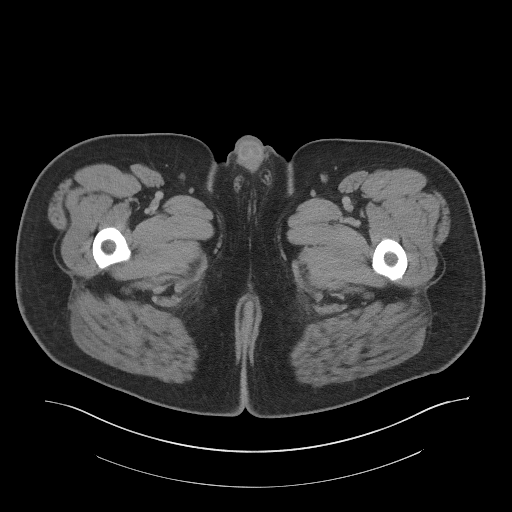
[im 12/120  bone]
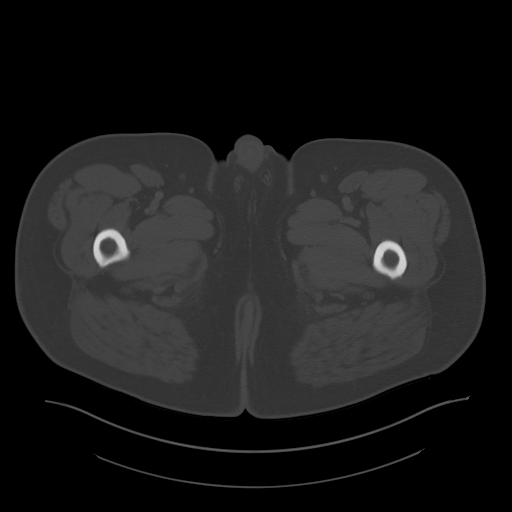
[im 23/120  soft-tissue]
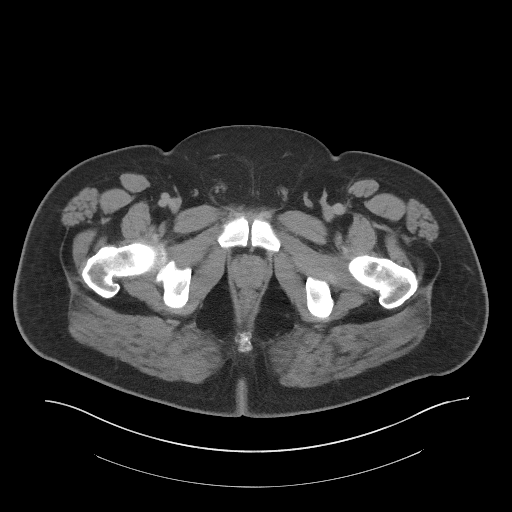
[im 35/120  soft-tissue]
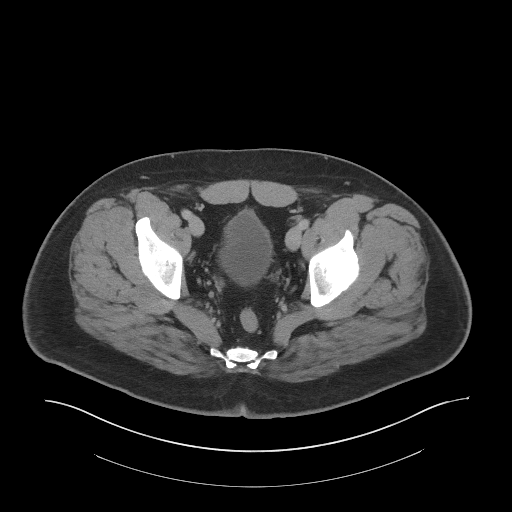
[im 46/120  soft-tissue]
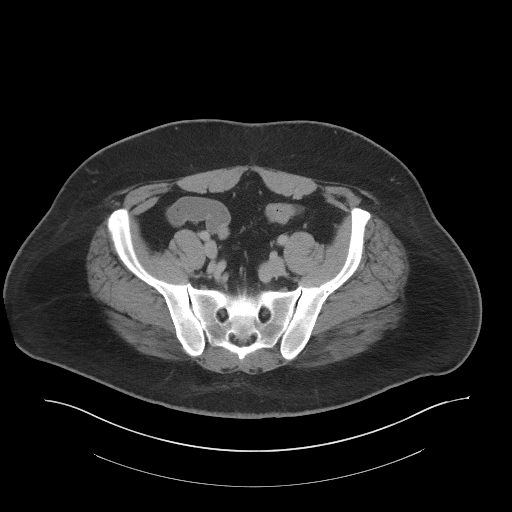
[im 63/120  soft-tissue]
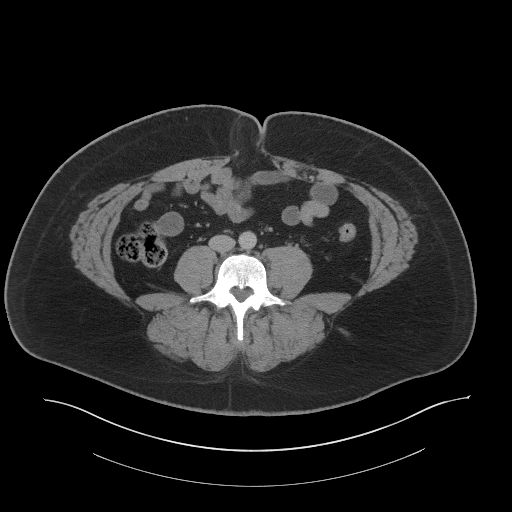
[im 74/120  soft-tissue]
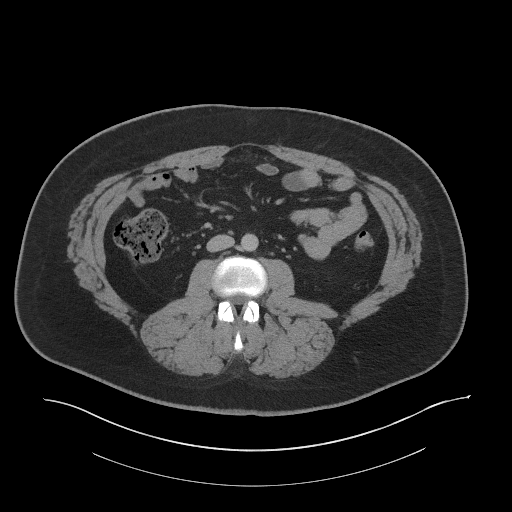
[im 86/120  soft-tissue]
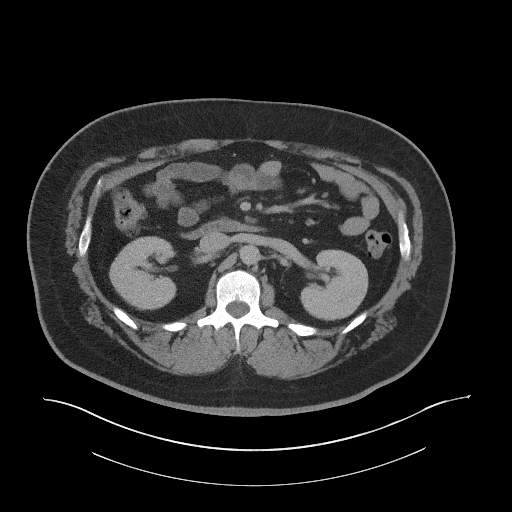
[im 97/120  soft-tissue]
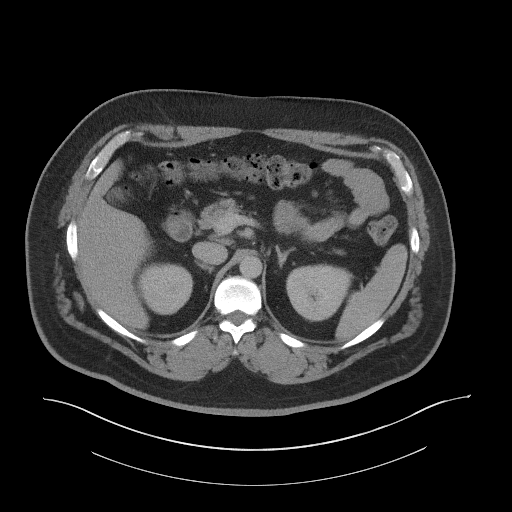
[im 97/120  lung]
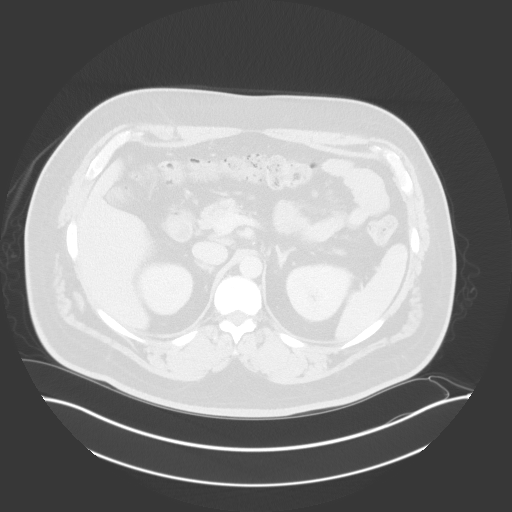
[im 103/120  lung]
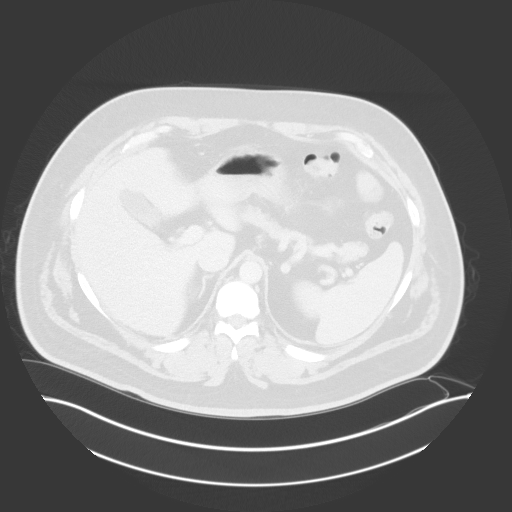
[im 108/120  soft-tissue]
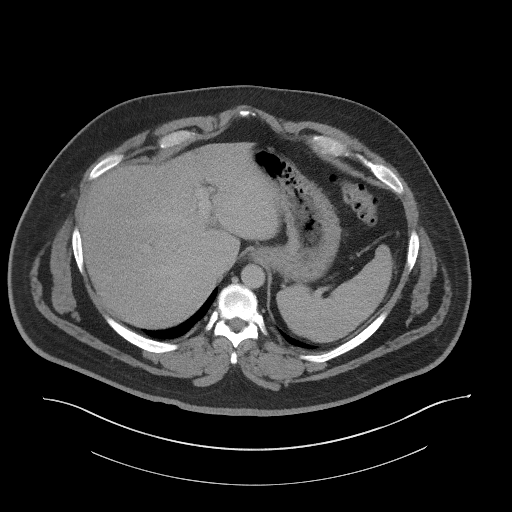
[im 108/120  lung]
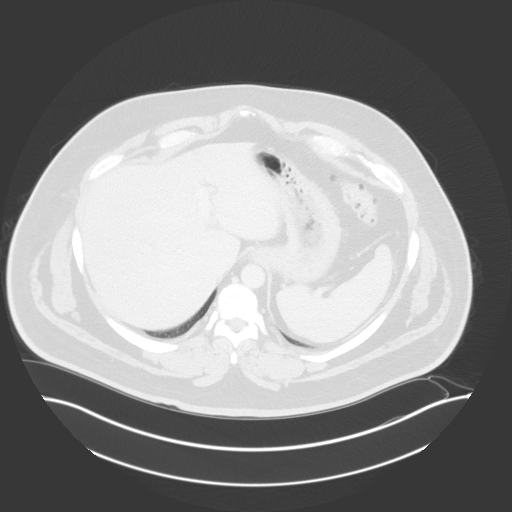
[im 108/120  bone]
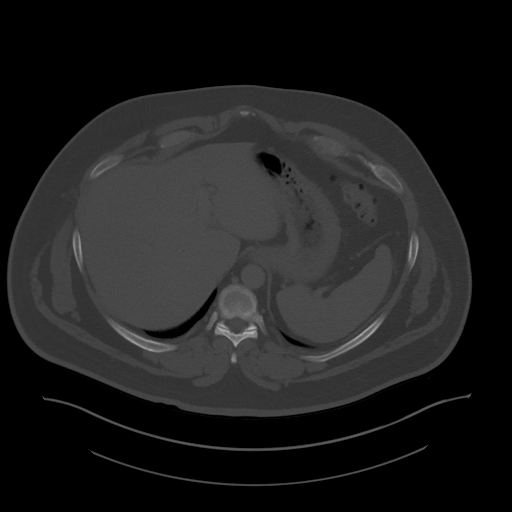
[im 114/120  lung]
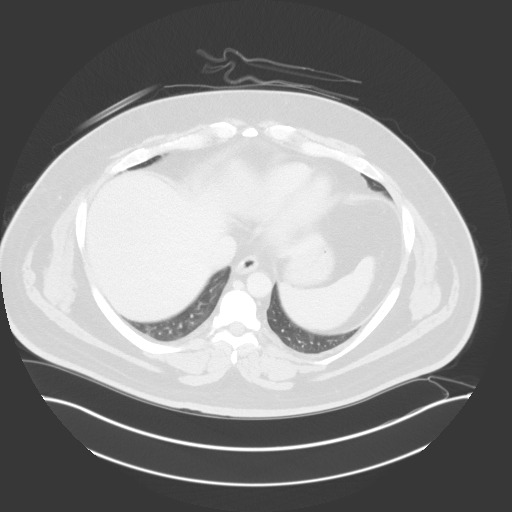

[Series 5: coronal st · coronal · 0.93mm/px · 3 of 109 slices shown]
[im 28/109  soft-tissue]
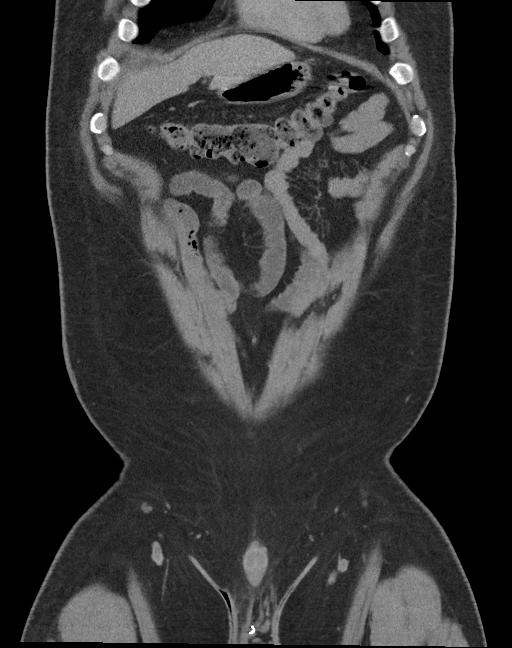
[im 55/109  soft-tissue]
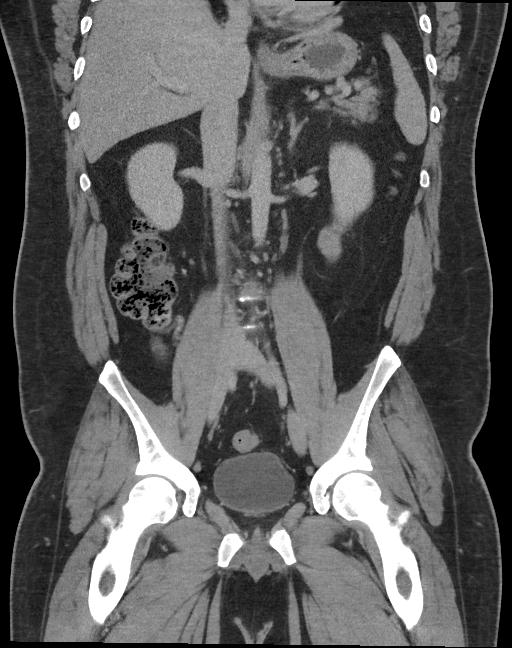
[im 82/109  soft-tissue]
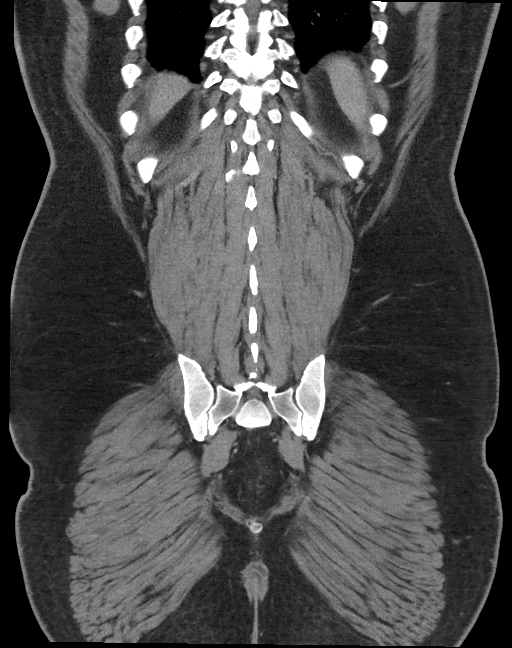

[14 of 46 positions shown; findings below may reference images not displayed]

RADIATION DOSE REDUCTION: This exam was performed according to the
departmental dose-optimization program which includes automated
exposure control, adjustment of the mA and/or kV according to
patient size and/or use of iterative reconstruction technique.

CONTRAST:  100mL OMNIPAQUE IOHEXOL 300 MG/ML  SOLN
FINDINGS: Lower chest: No acute abnormality.

Hepatobiliary: No focal liver abnormality is seen. No gallstones,
gallbladder wall thickening, or biliary dilatation.

Pancreas: Unremarkable. No pancreatic ductal dilatation or
surrounding inflammatory changes.

Spleen: Normal in size without focal abnormality.

Adrenals/Urinary Tract: Adrenal glands are unremarkable. Punctate
nonobstructing right renal calculus. Punctate nonobstructing left
renal calculus. No hydronephrosis. No renal mass. Normal bladder.

Stomach/Bowel: Stomach is within normal limits. No evidence of bowel
wall thickening, distention, or inflammatory changes. Appendix is
normal.

Vascular/Lymphatic: No significant vascular findings are present. No
enlarged abdominal or pelvic lymph nodes.

Reproductive: Prostate is unremarkable.

Other: No abdominal wall hernia or abnormality. No abdominopelvic
ascites.

Musculoskeletal: No acute osseous abnormality. No aggressive osseous
lesion. Mild bilateral facet arthropathy at L3-4, L4-5 and L5-S1.
IMPRESSION: 1. No acute abdominal or pelvic pathology.
2. Punctate nonobstructing bilateral renal calculi.
